# Patient Record
Sex: Female | Born: 1995 | Race: Black or African American | Hispanic: Yes | Marital: Single | Smoking: Never smoker
Health system: Southern US, Community
[De-identification: ages and names within clinical notes are randomized; demographics above are authoritative.]

## PROBLEM LIST (undated history)

## (undated) DIAGNOSIS — R61 Generalized hyperhidrosis: Secondary | ICD-10-CM

## (undated) DIAGNOSIS — J302 Other seasonal allergic rhinitis: Secondary | ICD-10-CM

## (undated) DIAGNOSIS — L409 Psoriasis, unspecified: Secondary | ICD-10-CM

---
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
## 2015-07-13 ENCOUNTER — Ambulatory Visit (HOSPITAL_COMMUNITY)
Admission: EM | Admit: 2015-07-13 | Discharge: 2015-07-13 | Disposition: A | Discharge status: Home / Self Care | Payer: Medicaid Other | Attending: Family Medicine | Admitting: Family Medicine

## 2015-07-13 ENCOUNTER — Encounter (HOSPITAL_COMMUNITY): Payer: Self-pay

## 2015-07-13 DIAGNOSIS — N898 Other specified noninflammatory disorders of vagina: Secondary | ICD-10-CM | POA: Diagnosis not present

## 2015-07-13 DIAGNOSIS — B373 Candidiasis of vulva and vagina: Secondary | ICD-10-CM | POA: Diagnosis not present

## 2015-07-13 DIAGNOSIS — L298 Other pruritus: Secondary | ICD-10-CM | POA: Insufficient documentation

## 2015-07-13 DIAGNOSIS — B3731 Acute candidiasis of vulva and vagina: Secondary | ICD-10-CM

## 2015-07-13 LAB — POCT URINALYSIS DIP (DEVICE)
BILIRUBIN URINE: NEGATIVE
GLUCOSE, UA: NEGATIVE mg/dL
Hgb urine dipstick: NEGATIVE
KETONES UR: NEGATIVE mg/dL
Nitrite: NEGATIVE
PH: 5.5 (ref 5.0–8.0)
Protein, ur: NEGATIVE mg/dL
Specific Gravity, Urine: 1.025 (ref 1.005–1.030)
Urobilinogen, UA: 0.2 mg/dL (ref 0.0–1.0)

## 2015-07-13 MED ORDER — FLUCONAZOLE 150 MG PO TABS: 150.0000 mg | ORAL_TABLET | Freq: Once | ORAL | Status: AC

## 2015-07-13 NOTE — ED Provider Notes (Addendum)
CSN: [id number]     Arrival date & time 07/13/15  1441 History   First MD Initiated Contact with Patient 07/13/15 1554     Chief Complaint  Patient presents with  . Vaginal Itching   (Consider location/radiation/quality/duration/timing/severity/associated sxs/prior Treatment) Patient is a 20 y.o. female presenting with vaginal itching. The history is provided by the patient. No language interpreter was used.  Vaginal Itching Pertinent negatives include no chest pain and no abdominal pain.  Patient presents with complaint of vaginal itching and dryness, began 2-3 days ago.  She describes as a sensation of dryness and swelling around the labia majora; some dryness and at the same time with some vaginal discharge, no malodorous.   She reports that she became sexually active in the beginning of March (2017) with a female partner, unprotected sex.  In the past 2 weeks she has been sexually active with another new partner.  Her LMP was March 19-23, usual timing for her. She took a Plan B on March 8th after the previous episode of unprotected sex.   Denies fevers or chills, no N/V/D, has had some mild constipation in recent weeks.  No dysuria, no frank hematuria, no urgency or frequency.   Past Medical History  Diagnosis Date  . Seasonal allergies   . Psoriasis   . Hyperhidrosis    History reviewed. No pertinent past surgical history. No family history on file. Social History  Substance Use Topics  . Smoking status: Never Smoker   . Smokeless tobacco: Never Used  . Alcohol Use: Yes     Comment: occasional   OB History    No data available     Review of Systems  Constitutional: Negative for fever, chills, diaphoresis, appetite change and fatigue.  Cardiovascular: Negative for chest pain.  Gastrointestinal: Positive for constipation. Negative for nausea, vomiting, abdominal pain, diarrhea and blood in stool.  Endocrine: Negative for polyuria.  Genitourinary: Negative for dysuria, urgency,  frequency, hematuria, flank pain, difficulty urinating and pelvic pain.    Allergies  Review of patient's allergies indicates no known allergies.  Home Medications   Prior to Admission medications   Medication Sig Start Date End Date Taking? Authorizing Provider  Glycerin, Laxative, (GLYCERIN ADULT) 3 G SUPP Place 1 suppository (3 g total) rectally as needed. 06/15/11   Domenick Gong, MD   Meds Ordered and Administered this Visit  Medications - No data to display  BP 120/80 mmHg  Pulse 80  Temp(Src) 98.6 F (37 C) (Oral)  SpO2 100%  LMP 06/23/2015 (Exact Date) No data found.   Physical Exam  Constitutional: She appears well-developed and well-nourished. No distress.  Neck: Neck supple.  Abdominal: Soft. She exhibits no distension and no mass. There is no tenderness. There is no rebound and no guarding.  Genitourinary:  Mildly erythematous generally aroudn the introitus, no vesicular or other discrete lesions noted. Thick white vaginal discharge throughout the vaginal vault. No discharge from os.  No bleeding.   Bimanual exam without CMT or adnexal tenderness. No uterine enlargement.   Skin: She is not diaphoretic.    ED Course  Procedures (including critical care time)  Labs Review Labs Reviewed - No data to display  Imaging Review No results found.   Visual Acuity Review  Right Eye Distance:   Left Eye Distance:   Bilateral Distance:    Right Eye Near:   Left Eye Near:    Bilateral Near:         MDM  No diagnosis  found. Patient with vaginal dryness and itching, on physical exam: thick white discharge most consistent with vulvovaginal candidiasis.  No findings of cervicitis or HSV2.  Treatment with fluconazole 150mg  po x1, repeat in 1 week if incompletely treated.   Cervical cx (GC/CT, wet prep) collected.  Discussed possibility of HSV2, [facility] or see primary doctor if develops genital sores or blisters, as prompt treatment  impacts time to resolution. Risk factors for STIs discussed.  Discussed HIV/RPR testing; [facility] and had negative HIV testing at that time, declines further testing today.   Paula ComptonJames Roena Sassaman, MD    Barbaraann BarthelJames O Bellami Farrelly, MD 07/13/15 1606  Barbaraann BarthelJames O Glennie Rodda, MD 07/13/15 [phone]

## 2015-07-13 NOTE — Discharge Instructions (Signed)
It is a pleasure to see you today.  I believe your symptoms are caused by vaginal candidiasis.    I am prescribing you DIFLUCAN 150mg  tablets, take 1 tablet by mouth ONE TIME.  You may repeat in 1 week if it gets better but not completely resolved.   We are sending cervical cultures and other studies for other infections (bacterial vaginosis, trichomonas).   You will be contacted with the results of these.   No sexual intercourse until resolved.  Follow up with urgent care center or primary doctor if continues, if worsens, or with other concerns.

## 2015-07-13 NOTE — ED Notes (Signed)
Patient states she is having vaginal irration associated with dryness. Vulva is very itching, she has taken a monistat on last night 07/13/2015 with no relief. No acute distress

## 2015-07-15 LAB — POCT PREGNANCY, URINE: Preg Test, Ur: NEGATIVE

## 2015-07-15 LAB — CERVICOVAGINAL ANCILLARY ONLY
CHLAMYDIA, DNA PROBE: NEGATIVE
NEISSERIA GONORRHEA: NEGATIVE

## 2015-07-16 LAB — CERVICOVAGINAL ANCILLARY ONLY: Wet Prep (BD Affirm): POSITIVE — AB

## 2015-07-24 ENCOUNTER — Telehealth (HOSPITAL_COMMUNITY): Payer: Self-pay | Admitting: Emergency Medicine

## 2015-07-24 NOTE — ED Notes (Signed)
LM on pt's VM [phone] Need to give lab results from recent visit on 07/13/15 Also let pt know labs can be obtained from MyChart  Per Dr. Dayton ScrapeMurray,  Please let patient know that test for candida (yeast) is positive; [facility]. Tests for gonorrhea/chlamydia were negative. Recheck for persistent sx's. LM

## 2015-11-04 ENCOUNTER — Encounter (HOSPITAL_COMMUNITY): Payer: Self-pay | Admitting: Emergency Medicine

## 2015-11-04 ENCOUNTER — Ambulatory Visit (HOSPITAL_COMMUNITY)
Admission: EM | Admit: 2015-11-04 | Discharge: 2015-11-04 | Disposition: A | Discharge status: Home / Self Care | Payer: Medicaid Other | Attending: Family Medicine | Admitting: Family Medicine

## 2015-11-04 DIAGNOSIS — R1031 Right lower quadrant pain: Secondary | ICD-10-CM

## 2015-11-04 DIAGNOSIS — R1011 Right upper quadrant pain: Secondary | ICD-10-CM

## 2015-11-04 DIAGNOSIS — R102 Pelvic and perineal pain: Secondary | ICD-10-CM

## 2015-11-04 LAB — POCT URINALYSIS DIP (DEVICE)
GLUCOSE, UA: NEGATIVE mg/dL
Ketones, ur: 15 mg/dL — AB
LEUKOCYTES UA: NEGATIVE
NITRITE: NEGATIVE
Protein, ur: NEGATIVE mg/dL
Specific Gravity, Urine: 1.025 (ref 1.005–1.030)
Urobilinogen, UA: 0.2 mg/dL (ref 0.0–1.0)
pH: 6 (ref 5.0–8.0)

## 2015-11-04 LAB — POCT PREGNANCY, URINE: PREG TEST UR: NEGATIVE

## 2015-11-04 LAB — POCT H PYLORI SCREEN: H. PYLORI SCREEN, POC: NEGATIVE

## 2015-11-04 NOTE — ED Triage Notes (Signed)
The patient presented to the [facility] with a complaint of abdominal pain in the epigastric area that started 6 days ago as bloating and became a lower cramping that started 2 days ago. The patient stated that she has had bowel movements but one today was tar like.

## 2015-11-04 NOTE — ED Provider Notes (Signed)
CSN: [id number]     Arrival date & time 11/04/15  1041 History   First MD Initiated Contact with Patient 11/04/15 1304     Chief Complaint  Patient presents with  . Abdominal Pain   (Consider location/radiation/quality/duration/timing/severity/associated sxs/prior Treatment) HPI 20 Y/O FEMALE WITH PAIN IN THE LOWER ABDOMEN FOR A WEEK. TREATED AT POSSIBLE ULCER. NO VAGINAL DISCHARGE. NO URINARY SYMPTOMS.WANTS TO BE EVAL FOR POSSIBLE ULCER, HAS BEEN USING PEPTO BISMOL WITHOUT RELIEF OF SYMPTOMS. Past Medical History:  Diagnosis Date  . Hyperhidrosis   . Psoriasis   . Seasonal allergies    History reviewed. No pertinent surgical history. History reviewed. No pertinent family history. Social History  Substance Use Topics  . Smoking status: Never Smoker  . Smokeless tobacco: Never Used  . Alcohol use Yes     Comment: occasional   OB History    No data available     Review of Systems  Denies: HEADACHE, NAUSEA,  CHEST PAIN, CONGESTION, DYSURIA, SHORTNESS OF BREATH  Allergies  Review of patient's allergies indicates no known allergies.  Home Medications   Prior to Admission medications   Medication Sig Start Date End Date Taking? Authorizing Provider  levonorgestrel-ethinyl estradiol (AVIANE,ALESSE,LESSINA) 0.1-20 MG-MCG tablet Take 1 tablet by mouth daily.   Yes Historical Provider, MD  fluconazole (DIFLUCAN) 150 MG tablet Take 1 tablet (150 mg total) by mouth once. 07/13/15   Barbaraann Barthel, MD  Glycerin, Laxative, (GLYCERIN ADULT) 3 G SUPP Place 1 suppository (3 g total) rectally as needed. 06/15/11   Domenick Gong, MD   Meds Ordered and Administered this Visit  Medications - No data to display  BP 116/75 (BP Location: Left Arm)   Pulse 97   Temp 98.7 F (37.1 C) (Oral)   Resp 18   LMP 10/07/2015 (Exact Date)   SpO2 97%  No data found.   Physical Exam NURSES NOTES AND VITAL SIGNS REVIEWED. CONSTITUTIONAL: Well developed, well nourished, no acute distress HEENT:  normocephalic, atraumatic EYES: Conjunctiva normal NECK:normal ROM, supple, no adenopathy PULMONARY:No respiratory distress, normal effort ABDOMINAL: Soft, ND,  , No CVAT, MILD TENDERNESS RLQ. NO REBOUND OR GUARDING. NO MASSES OR HERNIA.  MUSCULOSKELETAL: Normal ROM of all extremities,  SKIN: warm and dry without rash PSYCHIATRIC: Mood and affect, behavior are normal  Urgent Care Course   Clinical Course    Procedures (including critical care time)  Labs Review Labs Reviewed  POCT URINALYSIS DIP (DEVICE) - Abnormal; Notable for the following:       Result Value   Bilirubin Urine SMALL (*)    Ketones, ur 15 (*)    Hgb urine dipstick TRACE (*)    All other components within normal limits  POCT PREGNANCY, URINE  POCT H PYLORI SCREEN    Imaging Review No results found.   Visual Acuity Review  Right Eye Distance:   Left Eye Distance:   Bilateral Distance:    Right Eye Near:   Left Eye Near:    Bilateral Near:         MDM   1. Right lower quadrant pain   2. Pelvic pain in female     Patient is reassured that there are no issues that require transfer to higher level of care at this time or additional tests. Patient is advised to continue home symptomatic treatment. Patient is advised that if there are new or worsening symptoms to attend the emergency department, contact primary care provider, or return to UC. Instructions of care provided  discharged home in stable condition.    THIS NOTE WAS GENERATED USING A VOICE RECOGNITION SOFTWARE PROGRAM. ALL REASONABLE EFFORTS  WERE MADE TO PROOFREAD THIS DOCUMENT FOR ACCURACY.  I have verbally reviewed the discharge instructions with the patient. A printed AVS was given to the patient.  All questions were answered prior to discharge.       Tharon Aquas, PA 11/04/15 2050

## 2016-05-03 ENCOUNTER — Encounter (HOSPITAL_COMMUNITY): Payer: Self-pay | Admitting: Emergency Medicine

## 2016-05-03 ENCOUNTER — Emergency Department (HOSPITAL_COMMUNITY): Payer: Self-pay

## 2016-05-03 ENCOUNTER — Emergency Department (HOSPITAL_COMMUNITY)
Admission: EM | Admit: 2016-05-03 | Discharge: 2016-05-03 | Disposition: A | Discharge status: Home / Self Care | Payer: Self-pay | Attending: Emergency Medicine | Admitting: Emergency Medicine

## 2016-05-03 DIAGNOSIS — Z79899 Other long term (current) drug therapy: Secondary | ICD-10-CM | POA: Insufficient documentation

## 2016-05-03 DIAGNOSIS — Y999 Unspecified external cause status: Secondary | ICD-10-CM | POA: Insufficient documentation

## 2016-05-03 DIAGNOSIS — S4992XA Unspecified injury of left shoulder and upper arm, initial encounter: Secondary | ICD-10-CM | POA: Insufficient documentation

## 2016-05-03 DIAGNOSIS — Y929 Unspecified place or not applicable: Secondary | ICD-10-CM | POA: Insufficient documentation

## 2016-05-03 DIAGNOSIS — Y9351 Activity, roller skating (inline) and skateboarding: Secondary | ICD-10-CM | POA: Insufficient documentation

## 2016-05-03 MED ORDER — NAPROXEN 375 MG PO TABS: 375.0000 mg | ORAL_TABLET | Freq: Two times a day (BID) | ORAL | 0 refills | Status: DC

## 2016-05-03 MED ORDER — OXYCODONE-ACETAMINOPHEN 5-325 MG PO TABS
1.0000 | ORAL_TABLET | Freq: Once | ORAL | Status: AC
  Administered 2016-05-03: 1 via ORAL
  Filled 2016-05-03: qty 1

## 2016-05-03 MED ORDER — CYCLOBENZAPRINE HCL 10 MG PO TABS: 10.0000 mg | ORAL_TABLET | Freq: Two times a day (BID) | ORAL | 0 refills | Status: DC | PRN

## 2016-05-03 NOTE — ED Triage Notes (Signed)
Pt states she was rolling skating on a club and fell on her left shoulder now she hardly move this arm. C/o 6/10 pain.

## 2016-05-03 NOTE — ED Provider Notes (Signed)
[facility]-EMERGENCY DEPT Provider Note   CSN: [id number] Arrival date & time: 05/03/16  1324     History   Chief Complaint Chief Complaint  Patient presents with  . Shoulder Injury    HPI Christina Shepard is a 21 y.o. female.  Patient presents with left shoulder pain after fall while roller skating last night (05/02/16). She reports pain that became worse as the night went on. No head injury, neck pain, numbness or tingling. She is right hand dominant. There is pain in the shoulder with any movement of the left arm.   The history is provided by the patient. No language interpreter was used.  Shoulder Injury  Pertinent negatives include no chest pain and no abdominal pain.    Past Medical History:  Diagnosis Date  . Hyperhidrosis   . Psoriasis   . Seasonal allergies     There are no active problems to display for this patient.   History reviewed. No pertinent surgical history.  OB History    No data available       Home Medications    Prior to Admission medications   Medication Sig Start Date End Date Taking? Authorizing Provider  fluconazole (DIFLUCAN) 150 MG tablet Take 1 tablet (150 mg total) by mouth once. 07/13/15   Barbaraann Barthel, MD  Glycerin, Laxative, (GLYCERIN ADULT) 3 G SUPP Place 1 suppository (3 g total) rectally as needed. 06/15/11   Domenick Gong, MD  levonorgestrel-ethinyl estradiol (AVIANE,ALESSE,LESSINA) 0.1-20 MG-MCG tablet Take 1 tablet by mouth daily.    Historical Provider, MD    Family History History reviewed. No pertinent family history.  Social History Social History  Substance Use Topics  . Smoking status: Never Smoker  . Smokeless tobacco: Never Used  . Alcohol use Yes     Comment: occasional     Allergies   Patient has no known allergies.   Review of Systems Review of Systems  Constitutional: Negative for diaphoresis.  Cardiovascular: Negative.  Negative for chest pain.  Gastrointestinal: Negative.  Negative for abdominal  pain and nausea.  Musculoskeletal:       See HPI.  Skin: Negative.  Negative for wound.  Neurological: Negative.  Negative for numbness.     Physical Exam Updated Vital Signs BP 144/97 (BP Location: Right Arm)   Pulse 107   Temp 98.3 F (36.8 C) (Oral)   Resp 18   Ht 5\' 6"  (1.676 m)   Wt 55.8 kg   LMP 04/26/2016   SpO2 100%   BMI 19.85 kg/m   Physical Exam  Constitutional: She is oriented to person, place, and time. She appears well-developed and well-nourished.  HENT:  Head: Atraumatic.  Neck: Normal range of motion.  Cardiovascular: Intact distal pulses.   Pulmonary/Chest: Effort normal.  Musculoskeletal:  Left shoulder has no swelling, discoloration or deformity. Tender generally around the joint and into the left upper arm. No cervical tenderness.   Neurological: She is alert and oriented to person, place, and time.  Skin: Skin is warm and dry.     ED Treatments / Results  Labs (all labs ordered are listed, but only abnormal results are displayed) Labs Reviewed - No data to display  EKG  EKG Interpretation None       Radiology Dg Shoulder Left  Result Date: 05/03/2016 CLINICAL DATA:  Left shoulder pain after roller skating injury EXAM: LEFT SHOULDER - 2+ VIEW COMPARISON:  None. FINDINGS: There is no evidence of fracture or dislocation. There is no evidence of  arthropathy or other focal bone abnormality. Soft tissues are unremarkable. IMPRESSION: Negative. Electronically Signed   By: Tollie Ethavid  Kwon M.D.   On: 05/03/2016 04:16    Procedures Procedures (including critical care time)  Medications Ordered in ED Medications  oxyCODONE-acetaminophen (PERCOCET/ROXICET) 5-325 MG per tablet 1 tablet (1 tablet Oral Given 05/03/16 0422)     Initial Impression / Assessment and Plan / ED Course  I have reviewed the triage vital signs and the nursing notes.  Pertinent labs & imaging results that were available during my care of the patient were reviewed by me and  considered in my medical decision making (see chart for details).     Patient with left shoulder pain after fall. She cannot remember the specific mechanism of injury. No deformity, alteration in sensation or strength. Negative x-ray of the shoulder.   Sling provided. Will refer to ortho if pain persists. Likely muscular given worsening pain over time.   Final Clinical Impressions(s) / ED Diagnoses   Final diagnoses:  None   1. Left shoulder injury  New Prescriptions New Prescriptions   No medications on file     Elpidio AnisShari Raiya Stainback, Cordelia Poche-C 05/03/16 0455    Zadie Rhineonald Wickline, MD 05/03/16 [phone]

## 2016-05-11 ENCOUNTER — Ambulatory Visit (HOSPITAL_COMMUNITY)
Admission: AD | Admit: 2016-05-11 | Discharge: 2016-05-11 | Disposition: A | Discharge status: Home / Self Care | Payer: Self-pay | Source: Ambulatory Visit | Attending: Orthopedic Surgery | Admitting: Orthopedic Surgery

## 2016-05-11 ENCOUNTER — Ambulatory Visit (HOSPITAL_COMMUNITY): Payer: Self-pay | Admitting: Anesthesiology

## 2016-05-11 ENCOUNTER — Ambulatory Visit (HOSPITAL_COMMUNITY): Payer: Self-pay

## 2016-05-11 ENCOUNTER — Encounter (HOSPITAL_COMMUNITY): Payer: Self-pay | Admitting: Anesthesiology

## 2016-05-11 ENCOUNTER — Encounter (HOSPITAL_COMMUNITY)
Admission: AD | Disposition: A | Discharge status: Home / Self Care | Payer: Self-pay | Source: Ambulatory Visit | Attending: Orthopedic Surgery

## 2016-05-11 DIAGNOSIS — Y9351 Activity, roller skating (inline) and skateboarding: Secondary | ICD-10-CM | POA: Insufficient documentation

## 2016-05-11 DIAGNOSIS — Z419 Encounter for procedure for purposes other than remedying health state, unspecified: Secondary | ICD-10-CM

## 2016-05-11 DIAGNOSIS — S43005A Unspecified dislocation of left shoulder joint, initial encounter: Secondary | ICD-10-CM | POA: Insufficient documentation

## 2016-05-11 DIAGNOSIS — S42202A Unspecified fracture of upper end of left humerus, initial encounter for closed fracture: Secondary | ICD-10-CM | POA: Insufficient documentation

## 2016-05-11 HISTORY — PX: SHOULDER CLOSED REDUCTION: SHX1051

## 2016-05-11 LAB — HCG, SERUM, QUALITATIVE: Preg, Serum: NEGATIVE

## 2016-05-11 SURGERY — MANIPULATION, JOINT, SHOULDER, WITH ANESTHESIA
Anesthesia: General | Laterality: Left

## 2016-05-11 MED ORDER — PROPOFOL 10 MG/ML IV BOLUS
INTRAVENOUS | Status: AC
  Filled 2016-05-11: qty 20

## 2016-05-11 MED ORDER — DEXAMETHASONE SODIUM PHOSPHATE 10 MG/ML IJ SOLN
INTRAMUSCULAR | Status: AC
  Filled 2016-05-11: qty 2

## 2016-05-11 MED ORDER — HYDROMORPHONE HCL 1 MG/ML IJ SOLN: 0.2500 mg | INTRAMUSCULAR | Status: DC | PRN

## 2016-05-11 MED ORDER — ONDANSETRON HCL 4 MG/2ML IJ SOLN
INTRAMUSCULAR | Status: DC | PRN
  Administered 2016-05-11: 4 mg via INTRAVENOUS

## 2016-05-11 MED ORDER — MIDAZOLAM HCL 2 MG/2ML IJ SOLN
INTRAMUSCULAR | Status: AC
  Filled 2016-05-11: qty 2

## 2016-05-11 MED ORDER — SUGAMMADEX SODIUM 200 MG/2ML IV SOLN
INTRAVENOUS | Status: AC
  Filled 2016-05-11: qty 2

## 2016-05-11 MED ORDER — FENTANYL CITRATE (PF) 100 MCG/2ML IJ SOLN
INTRAMUSCULAR | Status: AC
  Filled 2016-05-11: qty 2

## 2016-05-11 MED ORDER — FENTANYL CITRATE (PF) 100 MCG/2ML IJ SOLN
INTRAMUSCULAR | Status: DC | PRN
  Administered 2016-05-11: 50 ug via INTRAVENOUS

## 2016-05-11 MED ORDER — MIDAZOLAM HCL 5 MG/5ML IJ SOLN
INTRAMUSCULAR | Status: DC | PRN
  Administered 2016-05-11: 2 mg via INTRAVENOUS

## 2016-05-11 MED ORDER — EPHEDRINE 5 MG/ML INJ
INTRAVENOUS | Status: AC
  Filled 2016-05-11: qty 10

## 2016-05-11 MED ORDER — MEPERIDINE HCL 25 MG/ML IJ SOLN: 6.2500 mg | INTRAMUSCULAR | Status: DC | PRN

## 2016-05-11 MED ORDER — LACTATED RINGERS IV SOLN
INTRAVENOUS | Status: DC
  Administered 2016-05-11: 14:00:00 via INTRAVENOUS

## 2016-05-11 MED ORDER — PROPOFOL 10 MG/ML IV BOLUS
INTRAVENOUS | Status: DC | PRN
  Administered 2016-05-11: 160 mg via INTRAVENOUS

## 2016-05-11 MED ORDER — LIDOCAINE 2% (20 MG/ML) 5 ML SYRINGE
INTRAMUSCULAR | Status: AC
  Filled 2016-05-11: qty 10

## 2016-05-11 MED ORDER — PROMETHAZINE HCL 25 MG/ML IJ SOLN: 6.2500 mg | INTRAMUSCULAR | Status: DC | PRN

## 2016-05-11 MED ORDER — ONDANSETRON HCL 4 MG/2ML IJ SOLN
INTRAMUSCULAR | Status: AC
  Filled 2016-05-11: qty 4

## 2016-05-11 MED ORDER — SUCCINYLCHOLINE CHLORIDE 200 MG/10ML IV SOSY
PREFILLED_SYRINGE | INTRAVENOUS | Status: DC | PRN
  Administered 2016-05-11: 80 mg via INTRAVENOUS

## 2016-05-11 MED ORDER — LIDOCAINE HCL (CARDIAC) 20 MG/ML IV SOLN
INTRAVENOUS | Status: DC | PRN
  Administered 2016-05-11: 60 mg via INTRAVENOUS

## 2016-05-11 MED ORDER — ROCURONIUM BROMIDE 50 MG/5ML IV SOSY
PREFILLED_SYRINGE | INTRAVENOUS | Status: AC
  Filled 2016-05-11: qty 5

## 2016-05-11 SURGICAL SUPPLY — 1 items: SLING ARM IMMOBILIZER MED (SOFTGOODS) ×3 IMPLANT

## 2016-05-11 NOTE — Anesthesia Preprocedure Evaluation (Signed)
Anesthesia Evaluation  Patient identified by MRN, date of birth, ID band Patient awake    Reviewed: Allergy & Precautions, NPO status , Patient's Chart, lab work & pertinent test results  Airway Mallampati: II  TM Distance: >3 FB Neck ROM: Full    Dental no notable dental hx.    Pulmonary neg pulmonary ROS,    Pulmonary exam normal breath sounds clear to auscultation       Cardiovascular negative cardio ROS Normal cardiovascular exam Rhythm:Regular Rate:Normal     Neuro/Psych negative neurological ROS  negative psych ROS   GI/Hepatic negative GI ROS, Neg liver ROS,   Endo/Other  negative endocrine ROS  Renal/GU negative Renal ROS     Musculoskeletal negative musculoskeletal ROS (+)   Abdominal   Peds  Hematology negative hematology ROS (+)   Anesthesia Other Findings   Reproductive/Obstetrics negative OB ROS                             Anesthesia Physical Anesthesia Plan  ASA: I  Anesthesia Plan: General   Post-op Pain Management:    Induction: Intravenous  Airway Management Planned: Mask  Additional Equipment:   Intra-op Plan:   Post-operative Plan: Extubation in OR  Informed Consent: I have reviewed the patients History and Physical, chart, labs and discussed the procedure including the risks, benefits and alternatives for the proposed anesthesia with the patient or authorized representative who has indicated his/her understanding and acceptance.   Dental advisory given  Plan Discussed with: CRNA  Anesthesia Plan Comments:        Anesthesia Quick Evaluation

## 2016-05-11 NOTE — H&P (Signed)
Christina CoralJessica Shepard is an 21 y.o. female.   Chief Complaint: Left shoulder pain HPI: Christina Shepard was roller skating and fell on 1/27. She thinks she fell directly onto her left posterior shoulder. She heard something tear. She was seen in the ED and discharged with NSAID's and muscle relaxers. She continues to have pain and restricted motion at the shoulder. She denies N/T.  Past Medical History:  Diagnosis Date  . Hyperhidrosis   . Psoriasis   . Seasonal allergies     History reviewed. No pertinent surgical history.  History reviewed. No pertinent family history. Social History:  reports that she has never smoked. She has never used smokeless tobacco. She reports that she drinks alcohol. She reports that she does not use drugs.  Allergies: No Known Allergies  No prescriptions prior to admission.    No results found for this or any previous visit (from the past 48 hour(s)). No results found.  Review of Systems  Musculoskeletal: Positive for joint pain (Left shoulder).  Psychiatric/Behavioral: Positive for depression.  All other systems reviewed and are negative.   Last menstrual period 04/26/2016. Physical Exam  Constitutional: She appears well-developed and well-nourished. No distress.  HENT:  Head: Normocephalic and atraumatic.  Eyes: Conjunctivae are normal. Right eye exhibits no discharge. Left eye exhibits no discharge. No scleral icterus.  Neck: Normal range of motion.  Cardiovascular: Normal rate.   Respiratory: Effort normal.  GI: Soft.  Musculoskeletal:       Left shoulder: She exhibits decreased range of motion, tenderness and bony tenderness.  Neurological: She is alert.  Skin: Skin is warm. She is not diaphoretic.  Psychiatric: She has a normal mood and affect.     Assessment/Plan Fall Left posterior shoulder dislocation -- OR today for closed reduction    Christina Shepard J., PA-C 05/11/2016, 11:21 AM

## 2016-05-11 NOTE — Anesthesia Procedure Notes (Signed)
Performed by: Samara DeistBECKNER, Christeen Lai B Pre-anesthesia Checklist: Patient identified, Emergency Drugs available, Suction available, Patient being monitored and Timeout performed Patient Re-evaluated:Patient Re-evaluated prior to inductionOxygen Delivery Method: Circle system utilized Preoxygenation: Pre-oxygenation with 100% oxygen Intubation Type: IV induction Ventilation: Mask ventilation without difficulty Placement Confirmation: positive ETCO2 Dental Injury: Teeth and Oropharynx as per pre-operative assessment

## 2016-05-11 NOTE — Discharge Instructions (Signed)
Sling at all times but it is ok to come out of sling when sitting or lying down to move elbow No lifting with left arm  Ok to shower without sling on We will refer you to physical therapy at next office appointment  Take medications that were prescribed to you at office visit  Ice as needed for pain/soreness

## 2016-05-11 NOTE — Transfer of Care (Signed)
Immediate Anesthesia Transfer of Care Note  Patient: Christina Shepard  Procedure(s) Performed: Procedure(s): CLOSED MANIPULATION SHOULDER W/ EXAM UNDER ANESTHESIA (Left)  Patient Location: PACU  Anesthesia Type:General  Level of Consciousness: awake and alert   Airway & Oxygen Therapy: Patient Spontanous Breathing and Patient connected to nasal cannula oxygen  Post-op Assessment: Report given to RN and Post -op Vital signs reviewed and stable  Post vital signs: Reviewed and stable  Last Vitals:  Vitals:   05/11/16 1247 05/11/16 1511  BP: 127/77 118/66  Pulse: 82   Resp: 18 (!) 21  Temp: 36.3 C 36.1 C    Last Pain:  Vitals:   05/11/16 1511  TempSrc:   PainSc: Asleep      Patients Stated Pain Goal: 5 (05/11/16 1314)  Complications: No apparent anesthesia complications

## 2016-05-12 ENCOUNTER — Encounter (HOSPITAL_COMMUNITY): Payer: Self-pay | Admitting: Orthopedic Surgery

## 2016-05-12 NOTE — Op Note (Signed)
[name]:  Duer, Yula             ACCOUNT NO.:  [id number]  MEDICAL RECORD NO.:  [id number]  LOCATION:  D36C                         FACILITY:  [facility]  PHYSICIAN:  Doralee AlbinoMichael H. Carola FrostHandy, M.D. DATE OF BIRTH:  Aug 15, 1995  DATE OF PROCEDURE:  05/11/2016 DATE OF DISCHARGE:                              OPERATIVE REPORT   PREOPERATIVE DIAGNOSIS:  Left shoulder posterior dislocation and left humeral head fracture.  POSTOPERATIVE DIAGNOSIS:  Left shoulder posterior dislocation and left humeral head fracture.  PROCEDURE: 1. Closed reduction of left posterior shoulder dislocation. 2. Closed treatment of impaction fracture of the left humeral head. 3. Stress evaluation under fluoro of the left shoulder.  SURGEON:  Doralee AlbinoMichael H. Carola FrostHandy, MD.  ASSISTANT: 1. Earney HamburgMichael Jeffrey, PA-C. 2. Montez MoritaKeith Paul, PA-C.  ANESTHESIA:  General.  COMPLICATIONS:  None.  DISPOSITION:  To PACU.  CONDITION:  Stable.  BRIEF SUMMARY AND INDICATION FOR PROCEDURE:  Christina CoralJessica Shepard is a very pleasant 21 year old female who sustained a fall onto the left shoulder 1 week ago.  Plain films were obtained at the ED, which did not clearly identify her posterior dislocation.  She was seen in followup at our office where physical examination revealed loss of external rotation of the shoulder and axillary view demonstrated posterior dislocation with impaction of the humeral head just off the articular surface crushed onto the glenoid.  I discussed with her and her father risks and benefits of closed reduction including the potential for required open reduction given the impaction fracture, possibility of subsequent instability, and need for further surgery including a stabilization procedure.  They acknowledged these risks and strongly wished to proceed.  BRIEF SUMMARY OF PROCEDURE:  The patient was taken to the operating room.  General anesthesia was induced.  One assistant pulled countertraction against the thorax and ribcage  and the other pulled gentle, but consistent and firm longitudinal traction gently unlocked the impaction fracture through manipulation and restored reduction and incongruity to the glenohumeral joint.  C-arm was brought in to confirm this and then under live x-ray, a stress evaluation range of motion was performed of the left shoulder, demonstrating no significant instability over a wide variety of ranges of motion including internal rotation. The patient was then placed into a sling, awakened from anesthesia, and transported to PACU in stable condition.  PROGNOSIS:  The patient will be in a sling with pendulum motion, active elbow motion.  Return to the office within a week for further evaluation and new x-rays.  She was given Norco at the office for pain control in addition to Toradol.     Doralee AlbinoMichael H. Carola FrostHandy, M.D.     [facility]  D:  05/12/2016  T:  05/12/2016  Job:  [id number]

## 2016-05-12 NOTE — Anesthesia Postprocedure Evaluation (Signed)
Anesthesia Post Note  Patient: Christina Shepard  Procedure(s) Performed: Procedure(s) (LRB): CLOSED MANIPULATION SHOULDER W/ EXAM UNDER ANESTHESIA (Left)  Patient location during evaluation: PACU Anesthesia Type: General Level of consciousness: awake and alert, awake and oriented Pain management: pain level controlled Vital Signs Assessment: post-procedure vital signs reviewed and stable Respiratory status: spontaneous breathing, nonlabored ventilation, respiratory function stable and patient connected to nasal cannula oxygen Cardiovascular status: stable and blood pressure returned to baseline Postop Assessment: no signs of nausea or vomiting Anesthetic complications: no       Last Vitals:  Vitals:   05/11/16 1600 05/11/16 1620  BP:  (!) 129/91  Pulse:  92  Resp: (!) 21   Temp: 36.7 C     Last Pain:  Vitals:   05/11/16 1620  TempSrc:   PainSc: 0-No pain                 Cecile HearingStephen Edward Turk

## 2018-09-23 IMAGING — RF DG C-ARM 61-120 MIN
1 series · 3 of 3 positions shown · non-contrast
Comparison: 05/03/2016

CLINICAL DATA: 20-year-old female post close reduction left
shoulder. Subsequent encounter.

EXAM:
DG C-ARM 61-120 MIN; LEFT SHOULDER - 2+ VIEW

[Series 1: run · 3 of 3 slices shown]
[im 1/3]
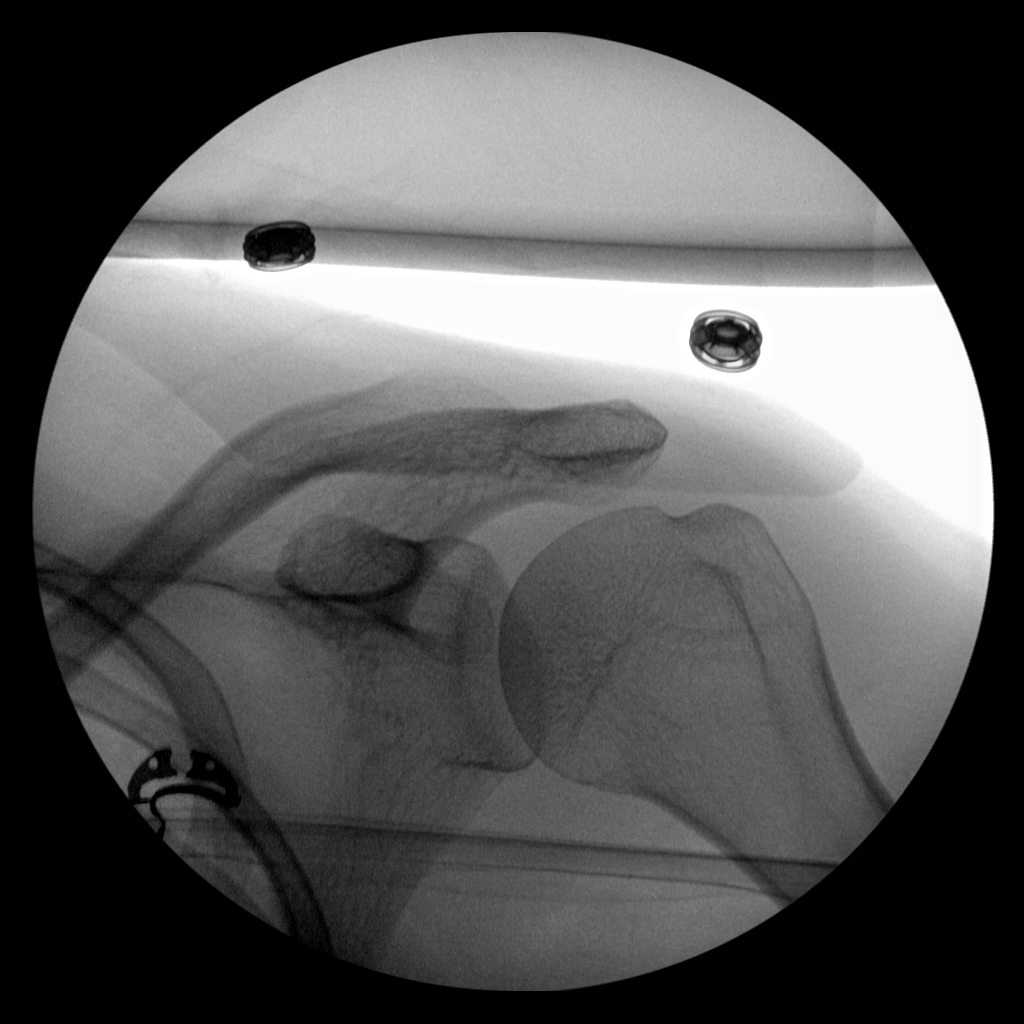
[im 2/3]
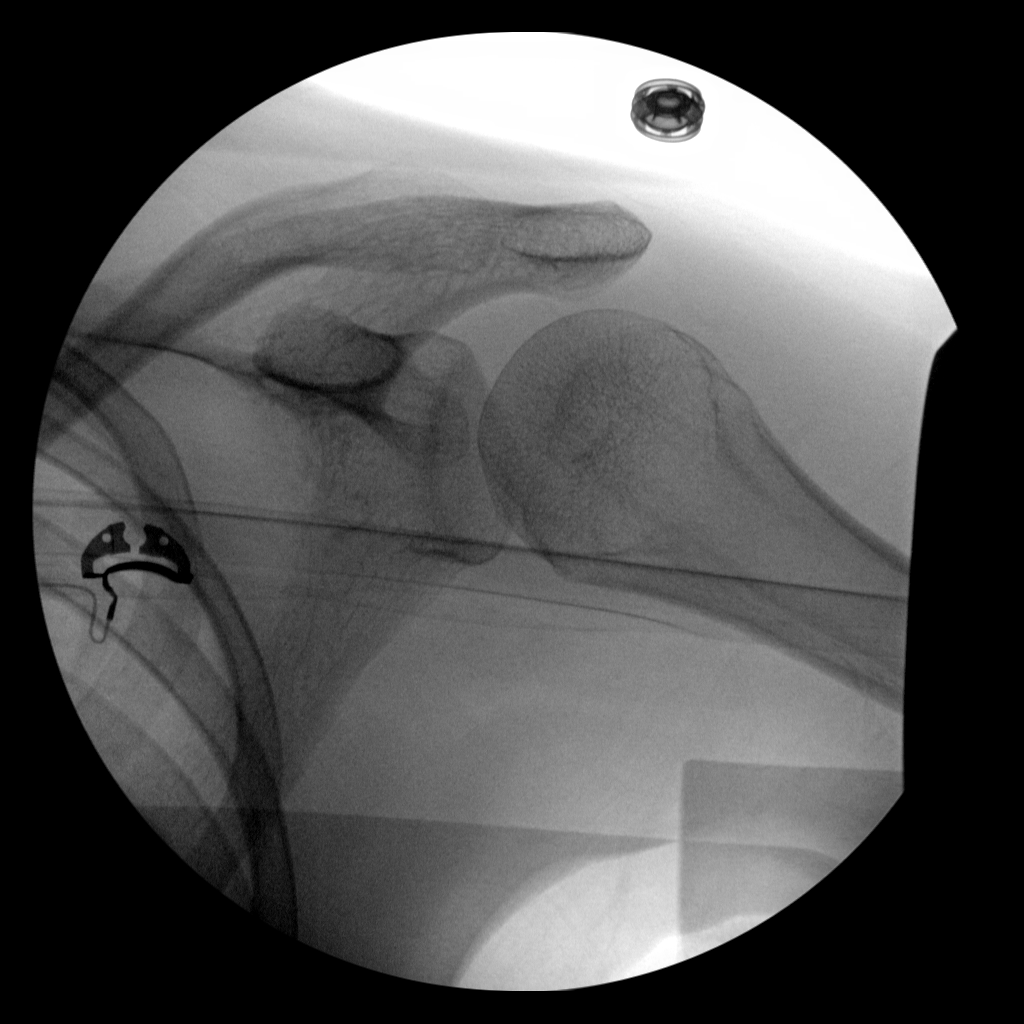
[im 3/3]
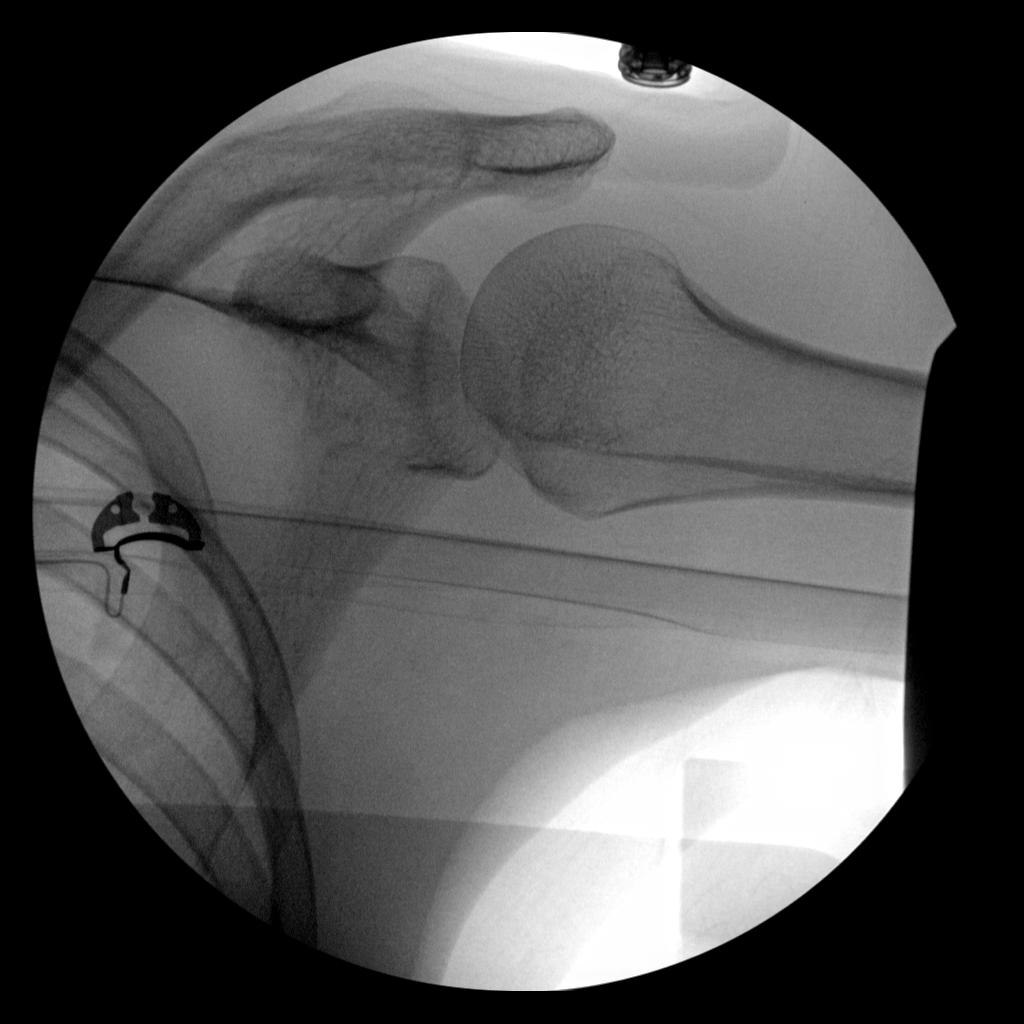

[3 of 3 positions shown; findings below may reference images not displayed]

FINDINGS: Three C-arm views submitted for review after surgery. This reveals
movement of the left humeral head with respect to the glenoid. On
the single frontal projection, the humeral head appears to be
located however, this cannot be confirmed without an axillary or
Y-view. There may be a compression fracture of the humeral head from
prior dislocation.
IMPRESSION: Manipulation of the left shoulder as noted above.

## 2018-11-12 ENCOUNTER — Other Ambulatory Visit: Payer: Self-pay | Admitting: Radiology

## 2018-11-12 DIAGNOSIS — Z20822 Contact with and (suspected) exposure to covid-19: Secondary | ICD-10-CM

## 2018-11-13 LAB — NOVEL CORONAVIRUS, NAA: SARS-CoV-2, NAA: NOT DETECTED

## 2019-04-11 ENCOUNTER — Ambulatory Visit: Payer: MEDICAID | Attending: Internal Medicine

## 2019-04-11 DIAGNOSIS — Z20822 Contact with and (suspected) exposure to covid-19: Secondary | ICD-10-CM

## 2019-04-14 LAB — NOVEL CORONAVIRUS, NAA: SARS-CoV-2, NAA: NOT DETECTED

## 2019-06-23 ENCOUNTER — Ambulatory Visit: Payer: MEDICAID | Attending: Internal Medicine

## 2019-06-23 DIAGNOSIS — Z23 Encounter for immunization: Secondary | ICD-10-CM

## 2019-06-23 NOTE — Progress Notes (Signed)
   Covid-19 Vaccination Clinic  Name:  Christina Shepard    MRN: [id number] DOB: [birth year]  06/23/2019  Ms. Osterloh was observed post Covid-19 immunization for 15 minutes without incident. She was provided with Vaccine Information Sheet and instruction to access the V-Safe system.   Ms. Brightbill was instructed to call 911 with any severe reactions post vaccine: Marland Kitchen Difficulty breathing  . Swelling of face and throat  . A fast heartbeat  . A bad rash all over body  . Dizziness and weakness   Immunizations Administered    Name Date Dose VIS Date Route   Pfizer COVID-19 Vaccine 06/23/2019 11:49 AM 0.3 mL 03/17/2019 Intramuscular   Manufacturer: [employer], [employer]   Lot: [id number]   NDC: [address]-7

## 2019-07-19 ENCOUNTER — Ambulatory Visit: Payer: MEDICAID | Attending: Internal Medicine

## 2019-07-19 DIAGNOSIS — Z23 Encounter for immunization: Secondary | ICD-10-CM

## 2019-07-19 NOTE — Progress Notes (Signed)
   Covid-19 Vaccination Clinic  Name:  Eliette Drumwright    MRN: [id number] DOB: 05-26-1995  07/19/2019  Ms. Teehan was observed post Covid-19 immunization for 15 minutes without incident. She was provided with Vaccine Information Sheet and instruction to access the V-Safe system.   Ms. Lomax was instructed to call 911 with any severe reactions post vaccine: Marland Kitchen Difficulty breathing  . Swelling of face and throat  . A fast heartbeat  . A bad rash all over body  . Dizziness and weakness   Immunizations Administered    Name Date Dose VIS Date Route   Pfizer COVID-19 Vaccine 07/19/2019 11:22 AM 0.3 mL 03/17/2019 Intramuscular   Manufacturer: [employer], [employer]   Lot: [id number]   NDC: [address]-9
